# Patient Record
Sex: Female | Born: 1990 | Race: White | Hispanic: No | Marital: Single | Smoking: Current every day smoker
Health system: Southern US, Community
[De-identification: ages and names within clinical notes are randomized; demographics above are authoritative.]

## PROBLEM LIST (undated history)

## (undated) DIAGNOSIS — F419 Anxiety disorder, unspecified: Secondary | ICD-10-CM

## (undated) DIAGNOSIS — K219 Gastro-esophageal reflux disease without esophagitis: Secondary | ICD-10-CM

## (undated) DIAGNOSIS — Z967 Presence of other bone and tendon implants: Secondary | ICD-10-CM

## (undated) DIAGNOSIS — K828 Other specified diseases of gallbladder: Secondary | ICD-10-CM

## (undated) HISTORY — PX: UMBILICAL HERNIA REPAIR: SHX196

## (undated) HISTORY — DX: Anxiety disorder, unspecified: F41.9

## (undated) HISTORY — PX: CYST EXCISION: SHX5701

## (undated) HISTORY — DX: Gastro-esophageal reflux disease without esophagitis: K21.9

## (undated) HISTORY — DX: Other specified diseases of gallbladder: K82.8

## (undated) HISTORY — PX: TUBAL LIGATION: SHX77

## (undated) HISTORY — DX: Presence of other bone and tendon implants: Z96.7

---
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
## 2012-08-04 HISTORY — PX: CHOLECYSTECTOMY: SHX55

## 2015-04-18 DIAGNOSIS — Z01818 Encounter for other preprocedural examination: Secondary | ICD-10-CM | POA: Insufficient documentation

## 2015-04-18 DIAGNOSIS — M25839 Other specified joint disorders, unspecified wrist: Secondary | ICD-10-CM | POA: Insufficient documentation

## 2015-04-24 DIAGNOSIS — S63599A Other specified sprain of unspecified wrist, initial encounter: Secondary | ICD-10-CM | POA: Insufficient documentation

## 2015-08-05 HISTORY — PX: OTHER SURGICAL HISTORY: SHX169

## 2016-11-12 HISTORY — PX: ESOPHAGOGASTRODUODENOSCOPY: SHX1529

## 2016-11-17 DIAGNOSIS — G43909 Migraine, unspecified, not intractable, without status migrainosus: Secondary | ICD-10-CM | POA: Diagnosis not present

## 2016-11-17 DIAGNOSIS — K219 Gastro-esophageal reflux disease without esophagitis: Secondary | ICD-10-CM

## 2016-11-17 DIAGNOSIS — F419 Anxiety disorder, unspecified: Secondary | ICD-10-CM

## 2016-11-17 DIAGNOSIS — N12 Tubulo-interstitial nephritis, not specified as acute or chronic: Secondary | ICD-10-CM | POA: Diagnosis not present

## 2016-11-17 DIAGNOSIS — A5900 Urogenital trichomoniasis, unspecified: Secondary | ICD-10-CM

## 2016-11-18 DIAGNOSIS — A5900 Urogenital trichomoniasis, unspecified: Secondary | ICD-10-CM | POA: Diagnosis not present

## 2016-11-18 DIAGNOSIS — K219 Gastro-esophageal reflux disease without esophagitis: Secondary | ICD-10-CM | POA: Diagnosis not present

## 2016-11-18 DIAGNOSIS — F419 Anxiety disorder, unspecified: Secondary | ICD-10-CM | POA: Diagnosis not present

## 2016-11-18 DIAGNOSIS — N12 Tubulo-interstitial nephritis, not specified as acute or chronic: Secondary | ICD-10-CM | POA: Diagnosis not present

## 2017-12-03 ENCOUNTER — Telehealth: Payer: Self-pay

## 2017-12-03 NOTE — Telephone Encounter (Signed)
Patient wants refill on Patnoprazole, do you want to give her any?  [facility]

## 2017-12-04 NOTE — Telephone Encounter (Signed)
Pantoprazole 40 mg p.o. once a day, #30 with 6 refills 

## 2017-12-07 MED ORDER — PANTOPRAZOLE SODIUM 40 MG PO TBEC: 40.0000 mg | DELAYED_RELEASE_TABLET | Freq: Every day | ORAL | 6 refills | Status: DC

## 2017-12-15 NOTE — Telephone Encounter (Signed)
Sent refills to patients pharmacy.  

## 2019-02-07 ENCOUNTER — Other Ambulatory Visit: Payer: Self-pay | Admitting: Gastroenterology

## 2019-02-07 ENCOUNTER — Telehealth: Payer: Self-pay | Admitting: Gastroenterology

## 2019-02-07 NOTE — Telephone Encounter (Signed)
Please do Protonix 40 mg p.o. once a day, 30 with 11 refills  Thx RG

## 2019-02-07 NOTE — Telephone Encounter (Signed)
Would you like to refill this medication?  

## 2019-02-08 MED ORDER — PANTOPRAZOLE SODIUM 40 MG PO TBEC: 40.0000 mg | DELAYED_RELEASE_TABLET | Freq: Every day | ORAL | 11 refills | Status: DC

## 2019-02-08 NOTE — Telephone Encounter (Signed)
Sent refill to patients pharmacy. 

## 2019-08-25 DIAGNOSIS — N6320 Unspecified lump in the left breast, unspecified quadrant: Secondary | ICD-10-CM | POA: Insufficient documentation

## 2019-09-14 DIAGNOSIS — Z09 Encounter for follow-up examination after completed treatment for conditions other than malignant neoplasm: Secondary | ICD-10-CM | POA: Insufficient documentation

## 2020-02-27 ENCOUNTER — Encounter: Payer: Self-pay | Admitting: Specialist

## 2020-03-05 ENCOUNTER — Ambulatory Visit (INDEPENDENT_AMBULATORY_CARE_PROVIDER_SITE_OTHER): Payer: Medicaid Other | Admitting: Podiatry

## 2020-03-05 ENCOUNTER — Other Ambulatory Visit: Payer: Self-pay

## 2020-03-05 DIAGNOSIS — M79671 Pain in right foot: Secondary | ICD-10-CM

## 2020-03-05 DIAGNOSIS — S9781XA Crushing injury of right foot, initial encounter: Secondary | ICD-10-CM

## 2020-03-05 DIAGNOSIS — S9031XA Contusion of right foot, initial encounter: Secondary | ICD-10-CM | POA: Diagnosis not present

## 2020-03-05 NOTE — Progress Notes (Signed)
  Subjective:  Patient ID: Stacy Jordan, female    DOB: [birth year],  MRN: [id number]  Chief Complaint  Patient presents with  . Foot Injury    ran Rt hallux joint with a pallet jack x Tues; 8/10 sharp pains  - w/ knot - per pt with redness, swelling -worse with walking or standing tx: icing, ehat, elevation, epsom salt     29 y.o. female presents with the above complaint. History confirmed with patient.   Objective:  Physical Exam: warm, good capillary refill, no trophic changes or ulcerative lesions, normal DP and PT pulses and normal sensory exam.   Right Foot: No gross instability, tenderness over the first second and third metatarsals and Lisfranc joint, no paresthesias, mild edema, no ecchymosis.  No deformity noted  Assessment:   1. Crushing injury of right foot, initial encounter   2. Pain in right foot   3. Contusion of right foot, initial encounter      Plan:  Patient was evaluated and treated and all questions answered.  -Advised that she likely has a deep contusion, however we should take x-ray to evaluate this.  Referred to imaging center for x-ray -Low top CAM boot has been applied.  WBAT -Advised on RICE protocol -If still symptomatic at next visit will x-ray to rule out stress fracture  Return in about 4 weeks (around 04/02/2020).

## 2020-03-22 ENCOUNTER — Ambulatory Visit (INDEPENDENT_AMBULATORY_CARE_PROVIDER_SITE_OTHER): Payer: Medicaid Other

## 2020-03-22 ENCOUNTER — Ambulatory Visit (INDEPENDENT_AMBULATORY_CARE_PROVIDER_SITE_OTHER): Payer: Medicaid Other | Admitting: Podiatry

## 2020-03-22 ENCOUNTER — Other Ambulatory Visit: Payer: Self-pay | Admitting: Podiatry

## 2020-03-22 ENCOUNTER — Other Ambulatory Visit: Payer: Self-pay

## 2020-03-22 DIAGNOSIS — S9781XA Crushing injury of right foot, initial encounter: Secondary | ICD-10-CM | POA: Diagnosis not present

## 2020-03-22 DIAGNOSIS — M7751 Other enthesopathy of right foot: Secondary | ICD-10-CM

## 2020-03-22 DIAGNOSIS — Z9889 Other specified postprocedural states: Secondary | ICD-10-CM

## 2020-03-22 DIAGNOSIS — S9781XD Crushing injury of right foot, subsequent encounter: Secondary | ICD-10-CM

## 2020-03-22 NOTE — Progress Notes (Signed)
  Subjective:  Patient ID: Stacy Jordan, female    DOB: 05-Jan-1991,  MRN: [id number]  Chief Complaint  Patient presents with  . Foot Injury    F/U Rt crushing injury Pt.states," I can walk on it but it's still apinful. pain is about the same; 7/10 ." -w/ little swellign tx: tylneol/IBU, RICE and boot    29 y.o. female presents with the above complaint. History confirmed with patient.   Objective:  Physical Exam: warm, good capillary refill, no trophic changes or ulcerative lesions, normal DP and PT pulses and normal sensory exam.   Right Foot: No tenderness over the first second and third metatarsals and Lisfranc joint, no paresthesias, mild edema, no ecchymosis.  No deformity noted. POP 1st MPJ medially, some joint laxity concerning for instability.  Radiographs: 3 views right foot no acute fractures or dislocations, no Lisfranc widening noted. Assessment:   1. Crushing injury of right foot, subsequent encounter   2. Post-operative state   3. Capsulitis of metatarsophalangeal (MTP) joint of right foot      Plan:  Patient was evaluated and treated and all questions answered.  Capsulitis 1st MPJ Right -XR taken and reviewed today. Without osseous injury -Trial WB in surgical shoe to offload the 1st MPJ -Discussed she may require bunionectomy with capsular plication in the future for symptomatic HAV with excessive joint mobility.  Return in about 1 month (around 04/22/2020).

## 2020-04-23 ENCOUNTER — Ambulatory Visit: Payer: Medicaid Other | Admitting: Podiatry

## 2020-04-26 ENCOUNTER — Ambulatory Visit: Payer: Medicaid Other | Admitting: Podiatry

## 2020-10-30 ENCOUNTER — Encounter: Payer: Self-pay | Admitting: Gastroenterology

## 2020-11-28 ENCOUNTER — Ambulatory Visit: Payer: Self-pay | Admitting: Gastroenterology

## 2020-12-25 ENCOUNTER — Ambulatory Visit: Payer: Self-pay | Admitting: Gastroenterology

## 2021-09-18 DIAGNOSIS — K429 Umbilical hernia without obstruction or gangrene: Secondary | ICD-10-CM | POA: Insufficient documentation

## 2021-11-06 DIAGNOSIS — G43009 Migraine without aura, not intractable, without status migrainosus: Secondary | ICD-10-CM | POA: Insufficient documentation

## 2021-11-06 DIAGNOSIS — R569 Unspecified convulsions: Secondary | ICD-10-CM | POA: Insufficient documentation

## 2022-05-20 ENCOUNTER — Ambulatory Visit: Payer: Medicaid Other | Admitting: Podiatry

## 2022-05-20 ENCOUNTER — Ambulatory Visit (INDEPENDENT_AMBULATORY_CARE_PROVIDER_SITE_OTHER): Payer: Medicaid Other

## 2022-05-20 DIAGNOSIS — M778 Other enthesopathies, not elsewhere classified: Secondary | ICD-10-CM

## 2022-05-20 DIAGNOSIS — M21611 Bunion of right foot: Secondary | ICD-10-CM | POA: Diagnosis not present

## 2022-05-20 DIAGNOSIS — M10071 Idiopathic gout, right ankle and foot: Secondary | ICD-10-CM

## 2022-05-20 DIAGNOSIS — M216X1 Other acquired deformities of right foot: Secondary | ICD-10-CM

## 2022-05-20 MED ORDER — MELOXICAM 15 MG PO TABS: 15.0000 mg | ORAL_TABLET | Freq: Every day | ORAL | 0 refills | Status: DC

## 2022-05-20 NOTE — Progress Notes (Signed)
Subjective:  Patient ID: Stacy Jordan, female    DOB: 11-02-1990,  MRN: [id number]  Chief Complaint  Patient presents with   Toe Injury    Patient injured right hallux in 2021. Patient complaining of tenderness to right hallux. Patient is currently taking Hydrocodone four times a day for neck pain but the medication is not helping the toe pain.     31 y.o. female presents with the above complaint.  Patient presents with pain in the right great toe joint.  She has noticed some increasing deformity of this area over the past few years.  She has pain with palpation on the area currently.  She denies a history of gout.  She is only able to wear certain shoes that are wider including sandals which she has on now.  She takes hydrocodone 4 times a day for neck pain.  She also has a smoking history and is currently smoking.  She does have a history of crushing injury to the right first metatarsal phalangeal joint in 2021.  Thinks that it is never healed up appropriately since then.  Review of Systems: Negative except as noted in the HPI. Denies N/V/F/Ch.  Past Medical History:  Diagnosis Date   Anxiety    Biliary dyskinesia    GERD (gastroesophageal reflux disease)    Metal plates in left arm     Current Outpatient Medications:    meloxicam (MOBIC) 15 MG tablet, Take 1 tablet (15 mg total) by mouth daily., Disp: 30 tablet, Rfl: 0   HYDROcodone-acetaminophen (NORCO) 7.5-325 MG tablet, Take 1 tablet by mouth 4 (four) times daily as needed., Disp: , Rfl:    pantoprazole (PROTONIX) 40 MG tablet, Take 1 tablet (40 mg total) by mouth daily., Disp: 30 tablet, Rfl: 11  Social History   Tobacco Use  Smoking Status Not on file  Smokeless Tobacco Not on file    Allergies  Allergen Reactions   Cyclobenzaprine Rash   Ketorolac Rash   Prednisone Rash   Tramadol Rash   Objective:  There were no vitals filed for this visit. There is no height or weight on file to calculate BMI. Constitutional  Well developed. Well nourished.  Vascular Dorsalis pedis pulses palpable bilaterally. Posterior tibial pulses palpable bilaterally. Capillary refill normal to all digits.  No cyanosis or clubbing noted. Pedal hair growth normal.  Neurologic Normal speech. Oriented to person, place, and time. Epicritic sensation to light touch grossly present bilaterally.  Dermatologic Nails well groomed and normal in appearance. No open wounds. No skin lesions.  Orthopedic: Normal joint ROM without pain or crepitus bilaterally. Hallux abductovalgus deformity present right foot Left 1st MPJ full range of motion. Left 1st TMT without gross hypermobility. Right 1st MPJ diminished range with pain  Right 1st TMT without gross hypermobility. Lesser digital contractures absent bilaterally.   Radiographs: Taken and reviewed. Hallux abductovalgus deformity present. Metatarsal parabola normal. 1st/2nd IMA: increased approx 14 degrees; TSP: 4.  No osseous erosions to suggest gout at the first MPJ medial aspect  Assessment:   1. Bunion, right foot   2. Acute idiopathic gout involving toe of right foot   3. Capsulitis of foot, right    Plan:  Patient was evaluated and treated and all questions answered.  #Hallux abductovalgus deformity, right foot #Possible acute gout first MPJ right foot -XR as above. -Discussed that her pain at the medial aspect of the first metatarsal head is possibly related to bunion deformity versus acute gout. -I recommend we proceed with  this trial of meloxicam 15 mg take once daily as needed for pain. -I saw the patient for serum uric acid test to evaluate for elevated uric acid level possibility of gout. -I also believe that the patient's bunion deformity could be to blame for her pain however it is quite severe at this time and I would not expect to be so acute. -Discussed that she may ultimately need surgical intervention if the bunion is ultimately the cause.  If she does have  uric acid level elevation I will consider adding methylprednisolone steroid Dosepak 4 mg for 6 days.  Return in about 2 months (around 07/20/2022) for follow up R foot bunion.

## 2022-07-21 ENCOUNTER — Ambulatory Visit (INDEPENDENT_AMBULATORY_CARE_PROVIDER_SITE_OTHER): Payer: Medicaid Other | Admitting: Podiatry

## 2022-07-21 DIAGNOSIS — Z91199 Patient's noncompliance with other medical treatment and regimen due to unspecified reason: Secondary | ICD-10-CM

## 2022-07-21 NOTE — Progress Notes (Signed)
Pt was a no show for apt, no charge 

## 2024-03-10 ENCOUNTER — Ambulatory Visit: Payer: Self-pay

## 2024-03-10 NOTE — Telephone Encounter (Signed)
 FYI Only or Action Required?: FYI only for provider.  Patient was last seen in primary care on N/A, New Patient.  Called Nurse Triage reporting No chief complaint on file..  Symptoms began several years ago.  Interventions attempted: Nothing.  Symptoms are: unchanged.  Triage Disposition: No disposition on file.  Patient/caregiver understands and will follow disposition?:    Copied from CRM [id number]. Topic: Clinical - Red Word Triage >> Mar 10, 2024  1:54 PM Charlet HERO wrote: Red Word that prompted transfer to Nurse Triage: Patient is stating that she has pain that radiates from her neck down to finger tips she states that it is getting worse she wants to establish care with [facility]. Reason for Disposition  Numbness in an arm or hand (i.e., loss of sensation)  Answer Assessment - Initial Assessment Questions 1. ONSET: When did the pain begin?      Since 2022  2. LOCATION: Where does it hurt?      Back of the neck, radiates into shoulders and fingertips  3. PATTERN Does the pain come and go, or has it been constant since it started?      Constant  4. SEVERITY: How bad is the pain?  (Scale 0-10; or none or slight stiffness, mild, moderate, severe)     10  5. RADIATION: Does the pain go anywhere else, shoot into your arms?      Into Shoulders and Fingertips, Back  6. CORD SYMPTOMS: Any weakness or numbness of the arms or legs?      Yes, in the arms and fingertips  7. CAUSE: What do you think is causing the neck pain?     Unsure  8. NECK OVERUSE: Any recent activities that involved turning or twisting the neck?     No  9. OTHER SYMPTOMS: Do you have any other symptoms? (e.g., headache, fever, chest pain, difficulty breathing, neck swelling)      Denies  10. PREGNANCY: Is there any chance you are pregnant? When was your last menstrual period?       No Reviewed red flag symptoms including: - Difficulty breathing - High or persistent  fever - Mental status changes - Inability to hydrate - Worsening or unrelieved pain Patient/caregiver instructed to seek immediate care if any of the above develop.  Provided return precautions and escalation instructions appropriate to the concern.  Protocols used: Neck Pain or Stiffness-A-AH

## 2024-03-14 ENCOUNTER — Encounter (HOSPITAL_BASED_OUTPATIENT_CLINIC_OR_DEPARTMENT_OTHER): Payer: Self-pay

## 2024-03-14 ENCOUNTER — Ambulatory Visit (INDEPENDENT_AMBULATORY_CARE_PROVIDER_SITE_OTHER): Admitting: Student

## 2024-03-14 ENCOUNTER — Encounter (HOSPITAL_BASED_OUTPATIENT_CLINIC_OR_DEPARTMENT_OTHER): Payer: Self-pay | Admitting: Student

## 2024-03-14 VITALS — BP 115/82 | HR 91 | Temp 98.9°F | Resp 16 | Ht 61.0 in | Wt 155.3 lb

## 2024-03-14 DIAGNOSIS — M5412 Radiculopathy, cervical region: Secondary | ICD-10-CM | POA: Diagnosis not present

## 2024-03-14 DIAGNOSIS — Z7689 Persons encountering health services in other specified circumstances: Secondary | ICD-10-CM | POA: Diagnosis not present

## 2024-03-14 DIAGNOSIS — F17200 Nicotine dependence, unspecified, uncomplicated: Secondary | ICD-10-CM | POA: Insufficient documentation

## 2024-03-14 DIAGNOSIS — Z716 Tobacco abuse counseling: Secondary | ICD-10-CM

## 2024-03-14 MED ORDER — NICOTINE POLACRILEX 2 MG MT LOZG: 2.0000 mg | LOZENGE | OROMUCOSAL | 11 refills | Status: AC | PRN

## 2024-03-14 MED ORDER — NICOTINE 21 MG/24HR TD PT24: 21.0000 mg | MEDICATED_PATCH | Freq: Every day | TRANSDERMAL | 2 refills | Status: AC

## 2024-03-14 MED ORDER — PREDNISONE 10 MG PO TABS: ORAL_TABLET | ORAL | 0 refills | Status: DC

## 2024-03-14 NOTE — Assessment & Plan Note (Signed)
 Chronic, not at goal. Chronic neck pain since 2022 with radicular symptoms from C2-C7 affecting the left arm and fingers, indicating cervical radiculopathy. Imaging shows disc degeneration from C2 to C7. Conservative therapy, including physical therapy, has been ineffective. No prior spinal surgery consultation. Minor reduced grip strength on the left with numbness and tingling in all fingers, suggesting multiple nerve root involvement. Timely intervention is crucial to prevent permanent nerve damage. - Refer to Dr. Gust for spinal surgery consultation and pain management. - Prescribe a 12-day course of prednisone  with tapering instructions to reduce inflammation and provide short-term pain relief. - Request MRI records from [facility] from 2022.

## 2024-03-14 NOTE — Progress Notes (Signed)
 New Patient Office Visit  Subjective    Patient ID: Stacy Jordan, female    DOB: 1990-09-09  Age: 33 y.o. MRN: [id number]  CC:  Chief Complaint  Patient presents with   Establish Care    Here to establish care.    Neck Pain    Neck pain since 2022, radiating down arms into fingers.     Discussed the use of AI scribe software for clinical note transcription with the patient, who gave verbal consent to proceed.  History of Present Illness   Stacy Jordan is a 33 year old female with cervical radiculopathy who presents for establishment of care with neck pain. She is accompanied by her spouse, Dale.  She has experienced neck pain since 2022, with the pain originating from the neck and radiating down her left arm, causing numbness in her entire hand. An MRI was conducted at [facility] in 2022, and the patient was told her discs were deteriorating from C2 to C7. Previous conservative treatments, including physical therapy, have not provided relief.  She has a 20-year history of smoking one pack per day and wants to quit smoking. Her living situation includes residing with her spouse, sister, and her sister's boyfriend, all of whom smoke. She has a history of blackout seizures, which contraindicates the use of Chantix and Wellbutrin for smoking cessation.  Her family history includes prostate cancer in her grandfather and breast cancer in her mother and grandmother. No personal history of diabetes or high cholesterol. She experiences numbness and tingling in all fingers of her left hand.     Screenings:  Colon Cancer: no fmh. N/A. Lung Cancer: 1 ppd for 20 years.  Breast Cancer: BC in mother and MGM Diabetes: indicated- check records HLD: indicated- check records.   Outpatient Encounter Medications as of 03/14/2024  Medication Sig   nicotine  (NICODERM CQ  - DOSED IN MG/24 HOURS) 21 mg/24hr patch Place 1 patch (21 mg total) onto the skin daily.   nicotine  polacrilex (NICOTINE   MINI) 2 MG lozenge Take 1 lozenge (2 mg total) by mouth as needed for smoking cessation.   predniSONE  (DELTASONE ) 10 MG tablet Begin with 60 mg daily (6 tablets total) in the morning with breakfast and taper as directed.   [DISCONTINUED] HYDROcodone-acetaminophen (NORCO) 7.5-325 MG tablet Take 1 tablet by mouth 4 (four) times daily as needed.   [DISCONTINUED] meloxicam  (MOBIC ) 15 MG tablet Take 1 tablet (15 mg total) by mouth daily.   [DISCONTINUED] pantoprazole  (PROTONIX ) 40 MG tablet Take 1 tablet (40 mg total) by mouth daily.   No facility-administered encounter medications on file as of 03/14/2024.    Past Medical History:  Diagnosis Date   Anxiety    Biliary dyskinesia    GERD (gastroesophageal reflux disease)    Metal plates in left arm     Past Surgical History:  Procedure Laterality Date   CHOLECYSTECTOMY  2014   due to biliary dyskinesia Dr Elray   CYST EXCISION     left breast   ESOPHAGOGASTRODUODENOSCOPY  11/12/2016   Mild gastritis. Otherwise normal EGD   OTHER SURGICAL HISTORY  2017   Metal Plate in left arm    TUBAL LIGATION     UMBILICAL HERNIA REPAIR      Family History  Problem Relation Age of Onset   Cervical cancer Mother    Breast cancer Mother    Thyroid cancer Father    Healthy Sister    Healthy Sister     Social History  Socioeconomic History   Marital status: Single    Spouse name: Not on file   Number of children: 1   Years of education: Not on file   Highest education level: Not on file  Occupational History   Not on file  Tobacco Use   Smoking status: Every Day    Current packs/day: 1.00    Average packs/day: 1 pack/day for 20.6 years (20.6 ttl pk-yrs)    Types: Cigarettes    Start date: 2005    Passive exposure: Never   Smokeless tobacco: Never  Vaping Use   Vaping status: Some Days   Substances: Nicotine   Substance and Sexual Activity   Alcohol use: Not Currently   Drug use: Never   Sexual activity: Not on file  Other  Topics Concern   Not on file  Social History Narrative   Not on file   Social Drivers of Health   Financial Resource Strain: Not on file  Food Insecurity: No Food Insecurity (03/14/2024)   Hunger Vital Sign    Worried About Running Out of Food in the Last Year: Never true    Ran Out of Food in the Last Year: Never true  Transportation Needs: No Transportation Needs (03/14/2024)   PRAPARE - Administrator, Civil Service (Medical): No    Lack of Transportation (Non-Medical): No  Physical Activity: Not on file  Stress: Not on file  Social Connections: Not on file  Intimate Partner Violence: Patient Unable To Answer (03/14/2024)   Humiliation, Afraid, Rape, and Kick questionnaire    Fear of Current or Ex-Partner: Patient unable to answer    Emotionally Abused: Patient unable to answer    Physically Abused: Patient unable to answer    Sexually Abused: Patient unable to answer    ROS  Per HPI      Objective    BP 115/82   Pulse 91   Temp 98.9 F (37.2 C) (Oral)   Resp 16   Ht 5' 1 (1.549 m)   Wt 155 lb 4.8 oz (70.4 kg)   LMP 03/10/2024 (Approximate)   SpO2 97%   BMI 29.34 kg/m   Physical Exam Constitutional:      General: She is not in acute distress.    Appearance: Normal appearance. She is not ill-appearing.  HENT:     Head: Normocephalic and atraumatic.     Right Ear: External ear normal.     Left Ear: External ear normal.     Nose: Nose normal.     Mouth/Throat:     Mouth: Mucous membranes are moist.     Pharynx: Oropharynx is clear.     Comments: Poor dentition Eyes:     General: No scleral icterus.    Extraocular Movements: Extraocular movements intact.     Conjunctiva/sclera: Conjunctivae normal.     Pupils: Pupils are equal, round, and reactive to light.  Neck:     Vascular: No carotid bruit.  Cardiovascular:     Rate and Rhythm: Normal rate and regular rhythm.     Pulses: Normal pulses.     Heart sounds: Normal heart sounds. No murmur  heard.    No friction rub.  Pulmonary:     Effort: Pulmonary effort is normal. No respiratory distress.     Breath sounds: Normal breath sounds. No wheezing, rhonchi or rales.  Musculoskeletal:        General: Normal range of motion.     Cervical back: Neck supple.  Right lower leg: No edema.     Left lower leg: No edema.     Comments: Neck exam  - Slightly reduced ROM with noted crepitus - Tenderness to palpation of occiput slightly past c7 and into left trapezius muscle.  - No signs of trauma - Hoffman Test: negative - Spurling Test: positive  Skin:    General: Skin is warm and dry.     Coloration: Skin is not jaundiced or pale.  Neurological:     Mental Status: She is alert.  Psychiatric:        Mood and Affect: Mood normal.        Behavior: Behavior normal.         Assessment & Plan:   Encounter to establish care  Cervical radiculopathy Assessment & Plan: Chronic, not at goal. Chronic neck pain since 2022 with radicular symptoms from C2-C7 affecting the left arm and fingers, indicating cervical radiculopathy. Imaging shows disc degeneration from C2 to C7. Conservative therapy, including physical therapy, has been ineffective. No prior spinal surgery consultation. Minor reduced grip strength on the left with numbness and tingling in all fingers, suggesting multiple nerve root involvement. Timely intervention is crucial to prevent permanent nerve damage. - Refer to Dr. Gust for spinal surgery consultation and pain management. - Prescribe a 12-day course of prednisone  with tapering instructions to reduce inflammation and provide short-term pain relief. - Request MRI records from [facility] from 2022.  Orders: -     predniSONE ; Begin with 60 mg daily (6 tablets total) in the morning with breakfast and taper as directed.  Dispense: 42 tablet; Refill: 0 -     Ambulatory referral to Spine Surgery  Nicotine  use disorder Assessment & Plan: Chronic, not at goal.  20-year smoking history of one pack per day. She desires to quit smoking. Smoking negatively impacts surgical outcomes and wound healing. Chantix and Wellbutrin are contraindicated due to seizures. Recommended nicotine  patches and lozenges for cessation. Quitting smoking is crucial for improved surgical outcomes and overall health. - Prescribe 21 mg nicotine  patches with instructions to use for one month before tapering. - Prescribe 2 mg nicotine  lozenges for cravings. - Provide information on 1-800-QUIT-NOW for additional support if needed. - Send prescriptions to [facility].   Orders: -     Nicotine ; Place 1 patch (21 mg total) onto the skin daily.  Dispense: 28 patch; Refill: 2 -     Nicotine  Polacrilex; Take 1 lozenge (2 mg total) by mouth as needed for smoking cessation.  Dispense: 100 tablet; Refill: 11  Tobacco abuse counseling    Return in about 6 weeks (around 04/25/2024) for neck pain/nicotine  cessation.   Taylore Hinde T Ileta Ofarrell, PA-C

## 2024-03-14 NOTE — Patient Instructions (Signed)
 It was nice to see you today!  As we discussed in clinic:  1-800-QUITNOW.  If you have any problems before your next visit feel free to message me via MyChart (minor issues or questions) or call the office, otherwise you may reach out to schedule an office visit.  Thank you! Rafan Sanders, PA-C

## 2024-03-14 NOTE — Assessment & Plan Note (Signed)
 Chronic, not at goal. 20-year smoking history of one pack per day. She desires to quit smoking. Smoking negatively impacts surgical outcomes and wound healing. Chantix and Wellbutrin are contraindicated due to seizures. Recommended nicotine  patches and lozenges for cessation. Quitting smoking is crucial for improved surgical outcomes and overall health. - Prescribe 21 mg nicotine  patches with instructions to use for one month before tapering. - Prescribe 2 mg nicotine  lozenges for cravings. - Provide information on 1-800-QUIT-NOW for additional support if needed. - Send prescriptions to [facility].

## 2024-03-15 ENCOUNTER — Encounter (HOSPITAL_BASED_OUTPATIENT_CLINIC_OR_DEPARTMENT_OTHER): Payer: Self-pay

## 2024-04-25 ENCOUNTER — Encounter (HOSPITAL_BASED_OUTPATIENT_CLINIC_OR_DEPARTMENT_OTHER): Payer: Self-pay | Admitting: Student

## 2024-04-25 ENCOUNTER — Ambulatory Visit (HOSPITAL_BASED_OUTPATIENT_CLINIC_OR_DEPARTMENT_OTHER): Admitting: Student

## 2024-04-25 VITALS — BP 115/81 | HR 87 | Ht 61.0 in | Wt 157.0 lb

## 2024-04-25 DIAGNOSIS — M5412 Radiculopathy, cervical region: Secondary | ICD-10-CM | POA: Diagnosis not present

## 2024-04-25 DIAGNOSIS — Z716 Tobacco abuse counseling: Secondary | ICD-10-CM | POA: Diagnosis not present

## 2024-04-25 MED ORDER — TOPIRAMATE 50 MG PO TABS: 50.0000 mg | ORAL_TABLET | Freq: Every day | ORAL | 3 refills | Status: AC

## 2024-04-25 NOTE — Patient Instructions (Signed)
 It was nice to see you today!  If you have any problems before your next visit feel free to message me via MyChart (minor issues or questions) or call the office, otherwise you may reach out to schedule an office visit.  Thank you! Pau Banh, PA-C

## 2024-04-25 NOTE — Progress Notes (Signed)
 Established Patient Office Visit  Subjective   Patient ID: Stacy Jordan, female    DOB: Jan 24, 1991  Age: 33 y.o. MRN: [id number]  Chief Complaint  Patient presents with   Neck Pain   Smoking Cessation    HPI  Discussed the use of AI scribe software for clinical note transcription with the patient, who gave verbal consent to proceed.  History of Present Illness   Stacy Jordan is a 33 year old female who presents for follow-up on neck pain and smoking cessation.  She is currently using a 21 mg nicotine  patch and lozenges as part of her smoking cessation plan. She is not smoking and feels positive about her progress. She plans to continue with the current regimen.  She experiences ongoing neck pain, previously managed with a steroid taper over twelve days, which provided some improvement. She is following up with neurosurgery, who are attempting to obtain an MRI of her neck and brain to assess a cyst. She has tried various treatments for pain, including ice, heat, Epsom salt baths, over-the-counter medications, gabapentin, pregabalin, and Cymbalta. She experienced nightmares with Lyrica and Effexor, and amitriptyline helped her sleep but did not alleviate pain. Topamax  was previously effective for her neuropathic pain, and she is considering restarting it.  She has a history of kidney stones, which is relevant as Topamax  can increase the risk of stones. She reports no side effects from Topamax  in the past.      Patient Active Problem List   Diagnosis Date Noted   Cervical radiculopathy 03/14/2024   Nicotine  use disorder 03/14/2024   Migraine without aura and without status migrainosus, not intractable 11/06/2021   Seizure-like activity (HCC) 11/06/2021   Umbilical hernia without obstruction and without gangrene 09/18/2021   Postoperative examination 09/14/2019   Left breast mass 08/25/2019   Tear of triangular fibrocartilage complex (TFCC) 04/24/2015   Preop examination 04/18/2015    Ulnar impaction syndrome 04/18/2015   Past Medical History:  Diagnosis Date   Anxiety    Biliary dyskinesia    GERD (gastroesophageal reflux disease)    Metal plates in left arm    Social History   Tobacco Use   Smoking status: Every Day    Current packs/day: 1.00    Average packs/day: 1 pack/day for 20.7 years (20.7 ttl pk-yrs)    Types: Cigarettes    Start date: 2005    Passive exposure: Never   Smokeless tobacco: Never  Vaping Use   Vaping status: Some Days   Substances: Nicotine   Substance Use Topics   Alcohol use: Not Currently   Drug use: Never   Allergies  Allergen Reactions   Ketorolac Rash   Prednisone  Rash   Tramadol Rash      ROS Per HPI.    Objective:     BP 115/81   Pulse 87   Ht 5' 1 (1.549 m)   Wt 157 lb 0.5 oz (71.2 kg)   LMP 04/21/2024 (Approximate)   SpO2 97%   BMI 29.67 kg/m     Physical Exam Constitutional:      General: She is not in acute distress.    Appearance: Normal appearance. She is not ill-appearing.  HENT:     Head: Normocephalic and atraumatic.     Nose: Nose normal.  Eyes:     General: No scleral icterus.    Conjunctiva/sclera: Conjunctivae normal.  Cardiovascular:     Rate and Rhythm: Normal rate and regular rhythm.     Heart sounds: Normal  heart sounds. No murmur heard.    No friction rub.  Pulmonary:     Effort: Pulmonary effort is normal. No respiratory distress.     Breath sounds: Normal breath sounds. No wheezing, rhonchi or rales.  Musculoskeletal:        General: Normal range of motion.     Comments: Neck: Mildly reduced ROM, stiff with pain on movement.  Skin:    General: Skin is warm and dry.     Coloration: Skin is not jaundiced or pale.  Neurological:     General: No focal deficit present.     Mental Status: She is alert.  Psychiatric:        Mood and Affect: Mood normal.        Behavior: Behavior normal.      No results found for any visits on 04/25/24.   The ASCVD Risk score  (Arnett DK, et al., 2019) failed to calculate for the following reasons:   The 2019 ASCVD risk score is only valid for ages 81 to 58    Assessment & Plan:   Assessment and Plan    Cervical Radiculopathy/Chronic neuropathic pain Chronic, pain not at goal. Chronic neck pain with limited range of motion persists. Previous steroid taper provided some relief. She is under the care of neurosurgery, which is planning further evaluation with MRI to assess for underlying causes. Chronic neuropathic pain persists. Previous medications such as gabapentin, pregabalin, and amitriptyline have been tried with limited success or adverse effects. Topamax  was previously effective and is being reconsidered as a treatment option. Discussed potential side effects of Topamax , including increased kidney stone risk and altered taste of carbonated beverages. - Initiate Topamax  at 50 mg daily, increasing to 100 mg daily after one week. - Monitor for side effects such as increased kidney stone risk and altered taste of carbonated beverages. - Plan to reassess pain management in 8 weeks.  Nicotine  dependence in remission Nicotine  dependence is in remission with the use of a 21 mg nicotine  patch and lozenges. She is not smoking and is tolerating the current regimen well. The plan is to gradually taper the nicotine  replacement therapy over time. - Continue 21 mg nicotine  patch and lozenges as needed. - Plan to reassess in 6-8 weeks to consider tapering to a 14 mg patch. - Discussed the importance of removing the patch if smoking occurs to avoid nausea and vomiting.      Return in about 8 weeks (around 06/20/2024) for smoking cessation.    Stepehn Eckard T Mehul Rudin, PA-C

## 2024-06-20 ENCOUNTER — Ambulatory Visit (INDEPENDENT_AMBULATORY_CARE_PROVIDER_SITE_OTHER): Admitting: Student

## 2024-06-20 ENCOUNTER — Encounter (HOSPITAL_BASED_OUTPATIENT_CLINIC_OR_DEPARTMENT_OTHER): Payer: Self-pay | Admitting: Student

## 2024-06-20 ENCOUNTER — Other Ambulatory Visit: Payer: Self-pay | Admitting: Medical Genetics

## 2024-06-20 VITALS — BP 115/86 | HR 81 | Temp 98.8°F | Resp 16 | Ht 61.0 in | Wt 159.0 lb

## 2024-06-20 DIAGNOSIS — R61 Generalized hyperhidrosis: Secondary | ICD-10-CM | POA: Diagnosis not present

## 2024-06-20 DIAGNOSIS — Z716 Tobacco abuse counseling: Secondary | ICD-10-CM

## 2024-06-20 DIAGNOSIS — N939 Abnormal uterine and vaginal bleeding, unspecified: Secondary | ICD-10-CM

## 2024-06-20 DIAGNOSIS — M545 Low back pain, unspecified: Secondary | ICD-10-CM | POA: Diagnosis not present

## 2024-06-20 DIAGNOSIS — G8929 Other chronic pain: Secondary | ICD-10-CM

## 2024-06-20 NOTE — Progress Notes (Signed)
 Established Patient Office Visit  Subjective   Patient ID: Stacy Jordan, female    DOB: 02-01-1991  Age: 33 y.o. MRN: [id number]  Chief Complaint  Patient presents with   Medical Management of Chronic Issues    follow up     HPI  Discussed the use of AI scribe software for clinical note transcription with the patient, who gave verbal consent to proceed.  History of Present Illness   Stacy Jordan is a 33 year old female who presents with concerns about irregular menstrual cycles and smoking cessation.  She experiences irregular menstrual cycles, occurring twice a month, accompanied by significant pain that impairs her daily activities. She has a history of tubal ligation and previously used the Nexplanon implant, which was removed during the procedure. She is not up to date on her Pap smear and is concerned due to a family history of cervical and breast cancer.  She has recently increased her smoking to less than half a pack a day following the death of her stepmother on May 09, 2024. She is using nicotine  lozenges and patches to aid in smoking cessation, although she finds the mint flavor of the lozenges difficult to tolerate.  She experiences chronic back pain, which she rates as a 10 out of 10 recently, although it was previously a 7 out of 10 before starting therapy. She is currently taking topiramate , which she finds helpful, and is seeing a specialist at [facility] for her back. She has undergone an MRI and is currently undergoing physical therapy for eight weeks.  She reports experiencing night sweats, describing them as 'drenching', requiring her to change clothes during the night. She also experiences fluctuations in body temperature, feeling cold one minute and sweating the next.  Her family history is significant for her mother having had cervical and breast cancer at the age of 25-49, and her grandmother was diagnosed with breast cancer in 2017.      Patient Active Problem List    Diagnosis Date Noted   Cervical radiculopathy 03/14/2024   Nicotine  use disorder 03/14/2024   Migraine without aura and without status migrainosus, not intractable 11/06/2021   Seizure-like activity (HCC) 11/06/2021   Umbilical hernia without obstruction and without gangrene 09/18/2021   Postoperative examination 09/14/2019   Left breast mass 08/25/2019   Tear of triangular fibrocartilage complex (TFCC) 04/24/2015   Preop examination 04/18/2015   Ulnar impaction syndrome 04/18/2015   Past Medical History:  Diagnosis Date   Anxiety    Biliary dyskinesia    GERD (gastroesophageal reflux disease)    Metal plates in left arm    Past Surgical History:  Procedure Laterality Date   CHOLECYSTECTOMY  2014   due to biliary dyskinesia Dr Elray   CYST EXCISION     left breast   ESOPHAGOGASTRODUODENOSCOPY  11/12/2016   Mild gastritis. Otherwise normal EGD   OTHER SURGICAL HISTORY  2017   Metal Plate in left arm    TUBAL LIGATION     UMBILICAL HERNIA REPAIR     Social History   Tobacco Use   Smoking status: Every Day    Current packs/day: 1.00    Average packs/day: 1 pack/day for 20.9 years (20.9 ttl pk-yrs)    Types: Cigarettes    Start date: 2005    Passive exposure: Never   Smokeless tobacco: Never  Vaping Use   Vaping status: Former   Quit date: 04/03/2024   Substances: Nicotine   Substance Use Topics   Alcohol use: Not Currently  Drug use: Never   Allergies  Allergen Reactions   Ketorolac Rash   Prednisone  Rash   Tramadol Rash      ROS Per HPI.    Objective:     BP 115/86   Pulse 81   Temp 98.8 F (37.1 C) (Oral)   Resp 16   Ht 5' 1 (1.549 m)   Wt 159 lb (72.1 kg)   LMP 06/07/2024 Comment: gets cyles twice a month  SpO2 97%   BMI 30.04 kg/m  BP Readings from Last 3 Encounters:  06/20/24 115/86  04/25/24 115/81  03/14/24 115/82   Wt Readings from Last 3 Encounters:  06/20/24 159 lb (72.1 kg)  04/25/24 157 lb 0.5 oz (71.2 kg)  03/14/24  155 lb 4.8 oz (70.4 kg)   SpO2 Readings from Last 3 Encounters:  06/20/24 97%  04/25/24 97%  03/14/24 97%      Physical Exam Constitutional:      General: She is not in acute distress.    Appearance: Normal appearance. She is not ill-appearing.  HENT:     Head: Normocephalic and atraumatic.     Nose: Nose normal.  Eyes:     General: No scleral icterus.    Conjunctiva/sclera: Conjunctivae normal.  Cardiovascular:     Rate and Rhythm: Normal rate and regular rhythm.     Heart sounds: Normal heart sounds. No murmur heard.    No friction rub.  Pulmonary:     Effort: Pulmonary effort is normal. No respiratory distress.     Breath sounds: Normal breath sounds. No wheezing, rhonchi or rales.  Musculoskeletal:        General: Normal range of motion.  Skin:    General: Skin is warm and dry.     Coloration: Skin is not jaundiced or pale.  Neurological:     General: No focal deficit present.     Mental Status: She is alert.  Psychiatric:        Mood and Affect: Mood normal.        Behavior: Behavior normal.         The ASCVD Risk score (Arnett DK, et al., 2019) failed to calculate for the following reasons:   The 2019 ASCVD risk score is only valid for ages 48 to 26    Assessment & Plan:   There are no diagnoses linked to this encounter.  Assessment and Plan    Abnormal uterine bleeding New problem, uncertain prognosis. Experiencing two menstrual cycles per month with significant pain, likely due to hormonal imbalance. Family history of cervical and breast cancer increases concern for underlying pathology. - Scheduled Pap smear in two weeks - Will consider referral to OB GYN if Pap smear results are abnormal - Discussed potential use of birth control options such as IUD or continuous birth control after Pap smear results  Night sweats Experiencing drenching night sweats requiring changes of clothes overnight. No major lymphadenopathy on examination. Family history of  breast and cervical cancer. - Scheduled follow-up in two weeks to reassess symptoms and update cancer screening (cervical) - Discussed potential genetic testing via [facility] for Lynch syndrome  Chronic low back pain Chronic, stable. Pain level increased from 7 to 10 despite topiramate  use. Currently undergoing physical therapy with no definitive treatment plan from specialist. Awaiting new MRI for neck and potential further intervention. - Continue physical therapy for eight weeks - Continue to follow with spinal specialist - Await new MRI for neck  Reviewed Ortho note from 03/01/24  Reviewed MRI C Spine from 08/11/19  Tobacco use disorder Chronic. Resumed smoking less than half a pack per day after a lapse following a personal loss. Currently using nicotine  lozenges and patches for cessation. - Continue nicotine  lozenges and patches - Advised against smoking while using nicotine  patches - Encouraged continued use of lozenges to manage cravings      Return in about 2 weeks (around 07/04/2024) for pap smear.    Takiera Mayo T Letizia Hook, PA-C

## 2024-06-20 NOTE — Patient Instructions (Signed)
 It was nice to see you today!  If you have any problems before your next visit feel free to message me via MyChart (minor issues or questions) or call the office, otherwise you may reach out to schedule an office visit.  Thank you! Pau Banh, PA-C

## 2024-06-24 DIAGNOSIS — N939 Abnormal uterine and vaginal bleeding, unspecified: Secondary | ICD-10-CM | POA: Insufficient documentation

## 2024-06-24 DIAGNOSIS — G8929 Other chronic pain: Secondary | ICD-10-CM | POA: Insufficient documentation

## 2024-07-06 ENCOUNTER — Ambulatory Visit (HOSPITAL_BASED_OUTPATIENT_CLINIC_OR_DEPARTMENT_OTHER): Admitting: Student

## 2024-07-14 ENCOUNTER — Other Ambulatory Visit

## 2024-07-14 DIAGNOSIS — Z006 Encounter for examination for normal comparison and control in clinical research program: Secondary | ICD-10-CM

## 2024-07-15 ENCOUNTER — Ambulatory Visit (INDEPENDENT_AMBULATORY_CARE_PROVIDER_SITE_OTHER): Admitting: Student

## 2024-07-15 ENCOUNTER — Encounter (HOSPITAL_BASED_OUTPATIENT_CLINIC_OR_DEPARTMENT_OTHER): Payer: Self-pay

## 2024-07-15 ENCOUNTER — Telehealth (HOSPITAL_BASED_OUTPATIENT_CLINIC_OR_DEPARTMENT_OTHER): Payer: Self-pay

## 2024-07-15 ENCOUNTER — Encounter (HOSPITAL_BASED_OUTPATIENT_CLINIC_OR_DEPARTMENT_OTHER): Payer: Self-pay | Admitting: Student

## 2024-07-15 ENCOUNTER — Other Ambulatory Visit (HOSPITAL_BASED_OUTPATIENT_CLINIC_OR_DEPARTMENT_OTHER): Payer: Self-pay

## 2024-07-15 ENCOUNTER — Other Ambulatory Visit (HOSPITAL_COMMUNITY)
Admission: RE | Admit: 2024-07-15 | Discharge: 2024-07-15 | Disposition: A | Discharge status: Home / Self Care | Source: Ambulatory Visit | Attending: Student | Admitting: Student

## 2024-07-15 VITALS — BP 127/86 | HR 102 | Temp 98.4°F | Resp 16 | Ht 61.0 in | Wt 159.4 lb

## 2024-07-15 DIAGNOSIS — N949 Unspecified condition associated with female genital organs and menstrual cycle: Secondary | ICD-10-CM | POA: Diagnosis not present

## 2024-07-15 DIAGNOSIS — Z23 Encounter for immunization: Secondary | ICD-10-CM

## 2024-07-15 DIAGNOSIS — N939 Abnormal uterine and vaginal bleeding, unspecified: Secondary | ICD-10-CM | POA: Diagnosis not present

## 2024-07-15 DIAGNOSIS — R61 Generalized hyperhidrosis: Secondary | ICD-10-CM | POA: Diagnosis not present

## 2024-07-15 DIAGNOSIS — Z01419 Encounter for gynecological examination (general) (routine) without abnormal findings: Secondary | ICD-10-CM | POA: Diagnosis not present

## 2024-07-15 DIAGNOSIS — N6311 Unspecified lump in the right breast, upper outer quadrant: Secondary | ICD-10-CM | POA: Diagnosis not present

## 2024-07-15 LAB — POCT URINE PREGNANCY: Preg Test, Ur: NEGATIVE

## 2024-07-15 NOTE — Patient Instructions (Signed)
 It was nice to see you today!  We will let you know how all your labs and imaging come back.  If you have any problems before your next visit feel free to message me via MyChart (minor issues or questions) or call the office, otherwise you may reach out to schedule an office visit.  Thank you! Jarrah Seher, PA-C

## 2024-07-15 NOTE — Addendum Note (Signed)
 Addended by: JOSHUA SHANE RAMAN on: 07/15/2024 02:28 PM   Modules accepted: Orders

## 2024-07-15 NOTE — Telephone Encounter (Signed)
 Per PCP, he wanted a POC pregnancy test. Left voicemail for pt asking her to all us  back.

## 2024-07-15 NOTE — Telephone Encounter (Signed)
 Copied from CRM #[id number]. Topic: General - Other >> Jul 15, 2024 12:09 PM Hadassah PARAS wrote: Reason for CRM: Pt is returning missed call from [place], CMA. Relayed message, pt stated she will come by this afternoon after lunch.

## 2024-07-15 NOTE — Progress Notes (Signed)
 Established Patient Office Visit  Subjective   Patient ID: Stacy Jordan, female    DOB: 1990/08/30  Age: 33 y.o. MRN: [id number]  Chief Complaint  Patient presents with   Papsmear    Doing papsmear.    Dysmenorrhea    Having double cycles. Having some lower abdominal stabbing pains. Hurting a little bit today. Makes her nausea.    HPI  Discussed the use of AI scribe software for clinical note transcription with the patient, who gave verbal consent to proceed.  History of Present Illness   Stacy Jordan is a 33 year old female who presents for a Pap smear and evaluation of night sweats and breast lumps.  She has been experiencing drenching night sweats for the past six months, with a minor cough at night but no significant cough accompanying the sweats.  She has a history of breast surgery for removal of areas in the breast. Recently, she has noticed a couple of different spots in her right breast, specifically in the upper outer quadrant. She performs breast exams at home but is uncertain about her regularity. Her family history is significant for breast cancer, with her mother being diagnosed in her early 39s.  She reports abnormal uterine bleeding and has been asked about symptoms consistent with polycystic ovary syndrome (PCOS), such as abnormal facial or body hair, to which she responds with a 'little bit.' She mentions having problems with her left kidney.  She has one child and her last menstrual period ended on December 7th.      Patient Active Problem List   Diagnosis Date Noted   Chronic low back pain 06/24/2024   Abnormal uterine bleeding (AUB) 06/24/2024   Cervical radiculopathy 03/14/2024   Nicotine  use disorder 03/14/2024   Migraine without aura and without status migrainosus, not intractable 11/06/2021   Seizure-like activity (HCC) 11/06/2021   Umbilical hernia without obstruction and without gangrene 09/18/2021   Postoperative examination 09/14/2019   Left  breast mass 08/25/2019   Tear of triangular fibrocartilage complex (TFCC) 04/24/2015   Preop examination 04/18/2015   Ulnar impaction syndrome 04/18/2015   Past Medical History:  Diagnosis Date   Anxiety    Biliary dyskinesia    GERD (gastroesophageal reflux disease)    Metal plates in left arm    Social History[1] Allergies[2]    ROS Per HPI.    Objective:     BP 127/86   Pulse (!) 102   Temp 98.4 F (36.9 C) (Oral)   Resp 16   Ht 5' 1 (1.549 m)   Wt 159 lb 6.4 oz (72.3 kg)   LMP 07/05/2024 (Approximate) Comment: gets cyles twice a month  SpO2 98%   BMI 30.12 kg/m  BP Readings from Last 3 Encounters:  07/15/24 127/86  06/20/24 115/86  04/25/24 115/81   Wt Readings from Last 3 Encounters:  07/15/24 159 lb 6.4 oz (72.3 kg)  06/20/24 159 lb (72.1 kg)  04/25/24 157 lb 0.5 oz (71.2 kg)   SpO2 Readings from Last 3 Encounters:  07/15/24 98%  06/20/24 97%  04/25/24 97%    Physical Exam Exam conducted with a chaperone present Stacy Jordan).  Constitutional:      General: She is not in acute distress.    Appearance: Normal appearance. She is not ill-appearing.  HENT:     Head: Normocephalic.     Nose: Nose normal.  Eyes:     Conjunctiva/sclera: Conjunctivae normal.  Genitourinary:    Vagina: Vaginal discharge present.  Cervix: Cervical motion tenderness and cervical bleeding present. No erythema.     Skin:    Coloration: Skin is not jaundiced or pale.  Neurological:     Mental Status: She is alert.  Psychiatric:        Mood and Affect: Mood normal.        Behavior: Behavior normal.       Assessment & Plan:   Assessment and Plan    Woman's Wellness Visit Routine wellness visit with overdue Pap smear since 2017. Family history of breast cancer with mother diagnosed in her early 35s. No current pregnancy. - Performed Pap smear - Ordered diagnostic mammogram for right breast lump - Ordered transvaginal ultrasound to evaluate uterus and  ovaries - Ordered CBC, CMP, prolactin, and thyroid function tests - Performed urine pregnancy test - Conducted self-swab for STD testing due to cervical motion tenderness  Abnormal uterine bleeding Possibly related to PCOS. No significant hirsutism reported. Differential includes PCOS and other causes of abnormal bleeding. - Ordered transvaginal ultrasound to assess for ovarian cysts indicative of PCOS - Ordered CBC, CMP, prolactin, and thyroid function tests to evaluate potential causes of abnormal bleeding  Right breast lump Palpable lump in the upper outer quadrant of the right breast. Family history of breast cancer. Diagnostic mammogram warranted for further evaluation. - Ordered diagnostic mammogram for right breast lump  Night sweats Six months without associated cough or other systemic symptoms. - Ordered CBC, CMP, prolactin, and thyroid function tests to evaluate potential causes of night sweats  Cervical motion tenderness Self-swab for STD testing performed due to tenderness. - Performed self-swab for STD testing      I personally spent a total of 36 minutes in the care of the patient today including preparing to see the patient, getting/reviewing separately obtained history, performing a medically appropriate exam/evaluation, counseling and educating, placing orders, and documenting clinical information in the EHR.  Return in about 4 weeks (around 08/12/2024), or if symptoms worsen or fail to improve, for Followup on labs and imaging.    Dalaney Needle T Malayna Noori, PA-C     [1]  Social History Tobacco Use   Smoking status: Every Day    Current packs/day: 0.25    Average packs/day: 1 pack/day for 20.9 years (20.9 ttl pk-yrs)    Types: Cigarettes    Start date: 2005    Passive exposure: Never   Smokeless tobacco: Never  Vaping Use   Vaping status: Former   Quit date: 04/03/2024   Substances: Nicotine   Substance Use Topics   Alcohol use: Not Currently   Drug use: Never   [2]  Allergies Allergen Reactions   Ketorolac Rash   Prednisone  Rash   Tramadol Rash

## 2024-07-16 LAB — CBC WITH DIFFERENTIAL/PLATELET
Basophils Absolute: 0.1 x10E3/uL (ref 0.0–0.2)
Basos: 1 %
EOS (ABSOLUTE): 0.1 x10E3/uL (ref 0.0–0.4)
Eos: 2 %
Hematocrit: 44.8 % (ref 34.0–46.6)
Hemoglobin: 14.9 g/dL (ref 11.1–15.9)
Immature Grans (Abs): 0 x10E3/uL (ref 0.0–0.1)
Immature Granulocytes: 0 %
Lymphocytes Absolute: 1.8 x10E3/uL (ref 0.7–3.1)
Lymphs: 33 %
MCH: 31.1 pg (ref 26.6–33.0)
MCHC: 33.3 g/dL (ref 31.5–35.7)
MCV: 94 fL (ref 79–97)
Monocytes Absolute: 0.5 x10E3/uL (ref 0.1–0.9)
Monocytes: 8 %
Neutrophils Absolute: 3.1 x10E3/uL (ref 1.4–7.0)
Neutrophils: 56 %
Platelets: 191 x10E3/uL (ref 150–450)
RBC: 4.79 x10E6/uL (ref 3.77–5.28)
RDW: 13.4 % (ref 11.7–15.4)
WBC: 5.6 x10E3/uL (ref 3.4–10.8)

## 2024-07-16 LAB — COMPREHENSIVE METABOLIC PANEL WITH GFR
ALT: 13 IU/L (ref 0–32)
AST: 15 IU/L (ref 0–40)
Albumin: 4.5 g/dL (ref 3.9–4.9)
Alkaline Phosphatase: 73 IU/L (ref 41–116)
BUN/Creatinine Ratio: 11 (ref 9–23)
BUN: 12 mg/dL (ref 6–20)
Bilirubin Total: 0.6 mg/dL (ref 0.0–1.2)
CO2: 20 mmol/L (ref 20–29)
Calcium: 9 mg/dL (ref 8.7–10.2)
Chloride: 108 mmol/L — ABNORMAL HIGH (ref 96–106)
Creatinine, Ser: 1.11 mg/dL — ABNORMAL HIGH (ref 0.57–1.00)
Globulin, Total: 2.1 g/dL (ref 1.5–4.5)
Glucose: 77 mg/dL (ref 70–99)
Potassium: 4.2 mmol/L (ref 3.5–5.2)
Sodium: 140 mmol/L (ref 134–144)
Total Protein: 6.6 g/dL (ref 6.0–8.5)
eGFR: 66 mL/min/1.73 (ref >59)

## 2024-07-16 LAB — TSH RFX ON ABNORMAL TO FREE T4: TSH: 1.96 u[IU]/mL (ref 0.450–4.500)

## 2024-07-16 LAB — PROLACTIN: Prolactin: 12.7 ng/mL (ref 4.8–33.4)

## 2024-07-19 ENCOUNTER — Ambulatory Visit (HOSPITAL_BASED_OUTPATIENT_CLINIC_OR_DEPARTMENT_OTHER): Payer: Self-pay | Admitting: Student

## 2024-07-19 ENCOUNTER — Telehealth (HOSPITAL_BASED_OUTPATIENT_CLINIC_OR_DEPARTMENT_OTHER): Payer: Self-pay

## 2024-07-19 LAB — NUSWAB VAGINITIS PLUS (VG+)
Candida albicans, NAA: NEGATIVE
Candida glabrata, NAA: NEGATIVE
Chlamydia trachomatis, NAA: NEGATIVE
Neisseria gonorrhoeae, NAA: NEGATIVE
Trich vag by NAA: NEGATIVE

## 2024-07-19 LAB — CYTOLOGY - PAP
Comment: NEGATIVE
High risk HPV: POSITIVE — AB

## 2024-07-19 NOTE — Telephone Encounter (Signed)
 Pt advised pregnancy test was negative.

## 2024-07-20 DIAGNOSIS — R87612 Low grade squamous intraepithelial lesion on cytologic smear of cervix (LGSIL): Secondary | ICD-10-CM | POA: Insufficient documentation

## 2024-07-22 ENCOUNTER — Ambulatory Visit (INDEPENDENT_AMBULATORY_CARE_PROVIDER_SITE_OTHER)
Admission: RE | Admit: 2024-07-22 | Discharge: 2024-07-22 | Disposition: A | Discharge status: Home / Self Care | Source: Ambulatory Visit | Attending: Student | Admitting: Student

## 2024-07-22 DIAGNOSIS — N939 Abnormal uterine and vaginal bleeding, unspecified: Secondary | ICD-10-CM

## 2024-07-22 LAB — GENECONNECT MOLECULAR SCREEN: Genetic Analysis Overall Interpretation: NEGATIVE

## 2024-07-25 ENCOUNTER — Other Ambulatory Visit (HOSPITAL_BASED_OUTPATIENT_CLINIC_OR_DEPARTMENT_OTHER): Payer: Self-pay

## 2024-07-25 ENCOUNTER — Other Ambulatory Visit (HOSPITAL_BASED_OUTPATIENT_CLINIC_OR_DEPARTMENT_OTHER)

## 2024-07-25 DIAGNOSIS — N179 Acute kidney failure, unspecified: Secondary | ICD-10-CM

## 2024-07-26 ENCOUNTER — Ambulatory Visit (HOSPITAL_BASED_OUTPATIENT_CLINIC_OR_DEPARTMENT_OTHER): Payer: Self-pay | Admitting: Student

## 2024-07-26 LAB — BASIC METABOLIC PANEL WITH GFR
BUN/Creatinine Ratio: 8 — ABNORMAL LOW (ref 9–23)
BUN: 8 mg/dL (ref 6–20)
CO2: 20 mmol/L (ref 20–29)
Calcium: 9.2 mg/dL (ref 8.7–10.2)
Chloride: 105 mmol/L (ref 96–106)
Creatinine, Ser: 1.04 mg/dL — ABNORMAL HIGH (ref 0.57–1.00)
Glucose: 107 mg/dL — ABNORMAL HIGH (ref 70–99)
Potassium: 3.9 mmol/L (ref 3.5–5.2)
Sodium: 138 mmol/L (ref 134–144)
eGFR: 72 mL/min/1.73

## 2024-07-26 LAB — CYSTATIN C: CYSTATIN C: 0.93 mg/L (ref 0.60–1.00)

## 2024-08-08 ENCOUNTER — Encounter (HOSPITAL_BASED_OUTPATIENT_CLINIC_OR_DEPARTMENT_OTHER): Payer: Self-pay | Admitting: Student

## 2024-08-10 ENCOUNTER — Encounter (HOSPITAL_BASED_OUTPATIENT_CLINIC_OR_DEPARTMENT_OTHER): Payer: Self-pay | Admitting: Student

## 2024-08-11 ENCOUNTER — Ambulatory Visit (HOSPITAL_BASED_OUTPATIENT_CLINIC_OR_DEPARTMENT_OTHER): Payer: Self-pay | Admitting: Student

## 2024-08-11 ENCOUNTER — Ambulatory Visit (INDEPENDENT_AMBULATORY_CARE_PROVIDER_SITE_OTHER): Admitting: Student

## 2024-08-11 ENCOUNTER — Ambulatory Visit (HOSPITAL_BASED_OUTPATIENT_CLINIC_OR_DEPARTMENT_OTHER)
Admission: RE | Admit: 2024-08-11 | Discharge: 2024-08-11 | Disposition: A | Discharge status: Home / Self Care | Source: Ambulatory Visit | Attending: Student | Admitting: Student

## 2024-08-11 VITALS — BP 121/83 | HR 93 | Temp 98.2°F | Resp 16 | Ht 61.0 in | Wt 160.9 lb

## 2024-08-11 DIAGNOSIS — R0602 Shortness of breath: Secondary | ICD-10-CM

## 2024-08-11 DIAGNOSIS — R112 Nausea with vomiting, unspecified: Secondary | ICD-10-CM | POA: Diagnosis not present

## 2024-08-11 DIAGNOSIS — J069 Acute upper respiratory infection, unspecified: Secondary | ICD-10-CM | POA: Diagnosis not present

## 2024-08-11 DIAGNOSIS — R6889 Other general symptoms and signs: Secondary | ICD-10-CM

## 2024-08-11 LAB — POC SOFIA 2 FLU + SARS ANTIGEN FIA
Influenza A, POC: NEGATIVE
Influenza B, POC: NEGATIVE
SARS Coronavirus 2 Ag: NEGATIVE

## 2024-08-11 MED ORDER — ONDANSETRON 4 MG PO TBDP: 4.0000 mg | ORAL_TABLET | Freq: Three times a day (TID) | ORAL | 0 refills | Status: AC | PRN

## 2024-08-11 NOTE — Progress Notes (Unsigned)
" ° °  Established Patient Office Visit  Subjective   Patient ID: Stacy Jordan, female    DOB: 22-Jan-1991  Age: 34 y.o. MRN: [id number]  Chief Complaint  Patient presents with   Medical Management of Chronic Issues    Follow up. Stomach stays tender from throwing up everything she eats.    URI    Sxs started two days ago. Having cough, cold sweats, SOB, little bit of sore throat, and feeling tired. No fever.     HPI  Discussed the use of AI scribe software for clinical note transcription with the patient, who gave verbal consent to proceed.  History of Present Illness          {History (Optional):23778}  ROS Per HPI.    Objective:     BP 121/83   Pulse 93   Temp 98.2 F (36.8 C) (Oral)   Resp 16   Ht 5' 1 (1.549 m)   Wt 160 lb 14.4 oz (73 kg)   LMP 08/10/2024 (Approximate) Comment: gets cyles twice a month  SpO2 97%   BMI 30.40 kg/m  {Vitals History (Optional):23777}  Physical Exam   Results for orders placed or performed in visit on 08/11/24  POC SOFIA 2 FLU + SARS ANTIGEN FIA  Result Value Ref Range   Influenza A, POC Negative Negative   Influenza B, POC Negative Negative   SARS Coronavirus 2 Ag Negative Negative    {Labs (Optional):23779}  The ASCVD Risk score (Arnett DK, et al., 2019) failed to calculate for the following reasons:   The 2019 ASCVD risk score is only valid for ages 53 to 52   * - Cholesterol units were assumed    Assessment & Plan:   Nausea and vomiting, unspecified vomiting type  Flu-like symptoms -     POC SOFIA 2 FLU + SARS ANTIGEN FIA    Assessment and Plan              No follow-ups on file.    Stacy Jordan [name], PA-C "

## 2024-08-11 NOTE — Patient Instructions (Signed)
 It was nice to see you today!  As we discussed in clinic: I will let you know how your chest xray comes back. I have given you zofran  for nausea.  If you have any problems before your next visit feel free to message me via MyChart (minor issues or questions) or call the office, otherwise you may reach out to schedule an office visit.  Thank you! Lowry Bala, PA-C

## 2024-08-31 ENCOUNTER — Ambulatory Visit (HOSPITAL_BASED_OUTPATIENT_CLINIC_OR_DEPARTMENT_OTHER): Admission: EM | Admit: 2024-08-31 | Discharge: 2024-08-31 | Disposition: A | Discharge status: Home / Self Care

## 2024-09-06 ENCOUNTER — Encounter (HOSPITAL_BASED_OUTPATIENT_CLINIC_OR_DEPARTMENT_OTHER): Payer: Self-pay | Admitting: Student

## 2024-09-06 ENCOUNTER — Encounter (HOSPITAL_COMMUNITY): Payer: Self-pay

## 2024-09-06 ENCOUNTER — Ambulatory Visit: Payer: Self-pay

## 2024-09-07 ENCOUNTER — Observation Stay (HOSPITAL_COMMUNITY)

## 2024-09-07 ENCOUNTER — Ambulatory Visit (HOSPITAL_BASED_OUTPATIENT_CLINIC_OR_DEPARTMENT_OTHER): Admitting: Family Medicine

## 2024-09-07 ENCOUNTER — Observation Stay (HOSPITAL_COMMUNITY): Admit: 2024-09-07 | Discharge: 2024-09-07

## 2024-09-07 DIAGNOSIS — R519 Headache, unspecified: Secondary | ICD-10-CM | POA: Diagnosis present

## 2024-09-07 DIAGNOSIS — Z9282 Status post administration of tPA (rtPA) in a different facility within the last 24 hours prior to admission to current facility: Principal | ICD-10-CM

## 2024-09-07 LAB — MRSA NEXT GEN BY PCR, NASAL: MRSA by PCR Next Gen: NOT DETECTED

## 2024-09-07 MED ORDER — STROKE: EARLY STAGES OF RECOVERY BOOK: Freq: Once | Status: DC

## 2024-09-07 MED ORDER — SENNOSIDES-DOCUSATE SODIUM 8.6-50 MG PO TABS: 1.0000 | ORAL_TABLET | Freq: Every evening | ORAL | Status: DC | PRN

## 2024-09-07 MED ORDER — TOPIRAMATE 25 MG PO TABS: 100.0000 mg | ORAL_TABLET | Freq: Every day | ORAL | Status: DC

## 2024-09-07 MED ORDER — ACETAMINOPHEN 160 MG/5ML PO SOLN: 650.0000 mg | ORAL | Status: DC | PRN

## 2024-09-07 MED ORDER — SODIUM CHLORIDE 0.9 % IV SOLN: INTRAVENOUS | Status: DC

## 2024-09-07 MED ORDER — PANTOPRAZOLE SODIUM 40 MG IV SOLR: 40.0000 mg | Freq: Every day | INTRAVENOUS | Status: DC

## 2024-09-07 MED ORDER — CHLORHEXIDINE GLUCONATE CLOTH 2 % EX PADS
6.0000 | MEDICATED_PAD | Freq: Every day | CUTANEOUS | Status: DC
  Administered 2024-09-07: 6 via TOPICAL

## 2024-09-07 MED ORDER — PANTOPRAZOLE SODIUM 40 MG PO TBEC: 40.0000 mg | DELAYED_RELEASE_TABLET | Freq: Every day | ORAL | Status: DC

## 2024-09-07 MED ORDER — ACETAMINOPHEN 650 MG RE SUPP: 650.0000 mg | RECTAL | Status: DC | PRN

## 2024-09-07 MED ORDER — ACETAMINOPHEN 325 MG PO TABS
650.0000 mg | ORAL_TABLET | ORAL | Status: DC | PRN
  Administered 2024-09-07: 650 mg via ORAL
  Filled 2024-09-07: qty 2

## 2024-09-07 MED ORDER — ORAL CARE MOUTH RINSE: 15.0000 mL | OROMUCOSAL | Status: DC | PRN

## 2024-09-07 NOTE — Plan of Care (Signed)
 Pt signed AMA before being seen. Per RN, pt and husband were belligerent with staff and demanded to leave AMA because children at home. Pt had TNK 2108 last night, will need 24h CT or MRI tonight but she refused it. We were asking if CT done before leaving, she also refused it. Per RN, pt neuro intact and HA much improved. Pt condition likely complicated migraine. Verbally recommended her to follow up with her PCP after leaving.   Ary Cummins, MD PhD Stroke Neurology 09/07/2024 11:05 AM

## 2024-09-07 NOTE — Progress Notes (Signed)
 Echocardiogram Attempted exam at 8am, patient refused.  Juliene JINNY Rucks 09/07/2024, 8:06 AM

## 2024-09-07 NOTE — Progress Notes (Signed)
 Pt being very belligerent cussing and threatening to hit staff and angrily refusing care. Dr. Jerri notified. This RN convinced pt to stay until 1000.

## 2024-09-07 NOTE — Progress Notes (Signed)
 PT Cancellation Note  Patient Details Name: Ranell Finelli MRN: [id number] DOB: 1991-05-25   Cancelled Treatment:    Reason Eval/Treat Not Completed: Other (comment). Pt refused PT evaluation and was desiring to leave AMA. Pt ultimately left AMA prior to PT eval.  Norene Ames, PT, DPT Acute Rehabilitation Services Secure chat preferred Office #: [phone]    Norene CHRISTELLA Ames 09/07/2024, 9:24 AM

## 2024-09-07 NOTE — H&P (Signed)
 Admission H&P    Chief Complaint: Numbness and headache HPI: Stacy Jordan is an 34 y.o. female who developed a severe dull continuous headache 3 days ago.  Yesterday in the evening she also developed left face arm and leg numbness, fluctuating left visual disturbance and slight weakness.  She was seen at outside hospital, was given TNK for stroke scale of 4.  She was transferred to [facility] for further management post TNK.  She continues to have a headache but all of her focal deficits have resolved.  History of Stroke: No.  mRankin: 0  Past Medical History:  Diagnosis Date   Anxiety    Biliary dyskinesia    GERD (gastroesophageal reflux disease)    Metal plates in left arm     Past Surgical History:  Procedure Laterality Date   CHOLECYSTECTOMY  2014   due to biliary dyskinesia Dr Elray   CYST EXCISION     left breast   ESOPHAGOGASTRODUODENOSCOPY  11/12/2016   Mild gastritis. Otherwise normal EGD   OTHER SURGICAL HISTORY  2017   Metal Plate in left arm    TUBAL LIGATION     UMBILICAL HERNIA REPAIR      Family History  Problem Relation Age of Onset   Cervical cancer Mother    Breast cancer Mother    Thyroid cancer Father    Healthy Sister    Healthy Sister    Social History:  reports that she has been smoking cigarettes. She started smoking about 21 years ago. She has a 21 pack-year smoking history. She has never been exposed to tobacco smoke. She has never used smokeless tobacco. She reports that she does not currently use alcohol. She reports that she does not use drugs.  Allergies: Allergies[1]  Medications Prior to Admission  Medication Sig Dispense Refill   cyclobenzaprine (FLEXERIL) 10 MG tablet Take 10 mg by mouth 2 (two) times daily.     nicotine  (NICODERM CQ  - DOSED IN MG/24 HOURS) 21 mg/24hr patch Place 1 patch (21 mg total) onto the skin daily. (Patient not taking: Reported on 08/11/2024) 28 patch 2   nicotine  polacrilex (NICOTINE  MINI) 2 MG lozenge Take  1 lozenge (2 mg total) by mouth as needed for smoking cessation. (Patient not taking: Reported on 08/11/2024) 100 tablet 11   ondansetron  (ZOFRAN -ODT) 4 MG disintegrating tablet Take 1 tablet (4 mg total) by mouth every 8 (eight) hours as needed for nausea or vomiting. 20 tablet 0   topiramate  (TOPAMAX ) 50 MG tablet Take 1 tablet (50 mg total) by mouth daily. In one week, increase to one tablet in the morning and one tablet at night (100mg  total). (Patient taking differently: Take 100 mg by mouth daily. In one week, increase to one tablet in the morning and one tablet at night (100mg  total).) 60 tablet 3    ROS: Full review of systems obtained no complaints except as noted in history.   Physical Examination: Blood pressure 99/81, pulse 86, temperature 98.3 F (36.8 C), temperature source Oral, resp. rate 17, last menstrual period 08/10/2024, SpO2 97%.  HEENT-  Normocephalic, no lesions, without obvious abnormality.  Normal external eye and conjunctiva.  Normal TM's bilaterally.  Normal auditory canals and external ears. Normal external nose, mucus membranes and septum.  Normal pharynx. Neck supple with no masses, nodes, nodules or enlargement. Cardiovascular - regular rate and rhythm Lungs - no tachypnea, retractions or cyanosis, Heart exam - S1, S2 normal, no murmur, no gallop, rate regular Abdomen - soft,  non-tender; bowel sounds normal; no masses,  no organomegaly Extremities - no edema  Neurologic Examination: Patient is alert and oriented x 3.  Pupils are equal round and reactive to light.  Speech is clear.  Language is fluent.  Face is symmetric.  Extraocular movements are normal.  Visual fields are full to confrontation.  Tone is normal.  Strength is 5 out of 5 in all extremities.  Sensation is normal to light touch in the face and extremities.  Results for orders placed or performed during the hospital encounter of 09/07/24 (from the past 48 hours)  MRSA Next Gen by PCR, Nasal      Status: None   Collection Time: 09/07/24 12:42 AM   Specimen: Nasal Mucosa; Nasal Swab  Result Value Ref Range   MRSA by PCR Next Gen NOT DETECTED NOT DETECTED    Comment: (NOTE) The GeneXpert MRSA Assay (FDA approved for NASAL specimens only), is one component of a comprehensive MRSA colonization surveillance program. It is not intended to diagnose MRSA infection nor to guide or monitor treatment for MRSA infections. Test performance is not FDA approved in patients less than 61 years old. Performed at [facility], [address], [place], [place] [address]    No results found. Assessment: 34 y.o. 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  Low suspicion for stroke but not completely excluded.  Stroke Risk Factors - smoking  Plan: 1. HgbA1c, fasting lipid panel 2. MRI, MRA  of the brain without contrast, MR venogram 3. PT consult, OT consult, Speech consult 4. Echocardiogram 5. Carotid dopplers 6. Prophylactic therapy-None 7. Risk factor modification 7. Telemetry monitoring  Stroke education, treatment, and current plan discussed with patient/significant other: Yes.    Eason Housman M Cattleya Dobratz 09/07/2024, 3:13 AM      [1]  Allergies Allergen Reactions   Ketorolac Rash   Prednisone  Rash   Tramadol Rash

## 2024-09-07 NOTE — Progress Notes (Signed)
 After extensive education and multiple attempts to encourage the patient to remain hospitalized, the patient voluntarily chose to leave AMA, acknowledging all associated risks and consequences. The patient removed the remaining PIV independently. AMA paperwork was completed and signed. Attending physician (Dr. Jerri) and [facility] were notified.

## 2025-02-08 ENCOUNTER — Ambulatory Visit (HOSPITAL_BASED_OUTPATIENT_CLINIC_OR_DEPARTMENT_OTHER): Admitting: Student
